# Patient Record
Sex: Female | Born: 1995 | Race: White | Hispanic: No | Marital: Single | Smoking: Never smoker
Health system: Southern US, Community
[De-identification: ages and names within clinical notes are randomized; demographics above are authoritative.]

---
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
## 2014-08-26 ENCOUNTER — Emergency Department (HOSPITAL_COMMUNITY)
Admission: EM | Admit: 2014-08-26 | Discharge: 2014-08-26 | Disposition: A | Discharge status: Home / Self Care | Payer: 59 | Attending: Emergency Medicine | Admitting: Emergency Medicine

## 2014-08-26 ENCOUNTER — Encounter (HOSPITAL_COMMUNITY): Payer: Self-pay | Admitting: Emergency Medicine

## 2014-08-26 DIAGNOSIS — Y998 Other external cause status: Secondary | ICD-10-CM | POA: Insufficient documentation

## 2014-08-26 DIAGNOSIS — Y9389 Activity, other specified: Secondary | ICD-10-CM | POA: Diagnosis not present

## 2014-08-26 DIAGNOSIS — S8991XA Unspecified injury of right lower leg, initial encounter: Secondary | ICD-10-CM | POA: Diagnosis present

## 2014-08-26 DIAGNOSIS — S8011XA Contusion of right lower leg, initial encounter: Secondary | ICD-10-CM | POA: Diagnosis not present

## 2014-08-26 DIAGNOSIS — Y92410 Unspecified street and highway as the place of occurrence of the external cause: Secondary | ICD-10-CM | POA: Insufficient documentation

## 2014-08-26 MED ORDER — IBUPROFEN 200 MG PO TABS
600.0000 mg | ORAL_TABLET | Freq: Once | ORAL | Status: AC
  Administered 2014-08-26: 600 mg via ORAL
  Filled 2014-08-26: qty 3

## 2014-08-26 MED ORDER — IBUPROFEN 600 MG PO TABS: 600.0000 mg | ORAL_TABLET | Freq: Four times a day (QID) | ORAL | Status: AC | PRN

## 2014-08-26 NOTE — Discharge Instructions (Signed)
Contusion °A contusion is a deep bruise. Contusions happen when an injury causes bleeding under the skin. Signs of bruising include pain, puffiness (swelling), and discolored skin. The contusion may turn blue, purple, or yellow. °HOME CARE  °· Put ice on the injured area. °¨ Put ice in a plastic bag. °¨ Place a towel between your skin and the bag. °¨ Leave the ice on for 15-20 minutes, 03-04 times a day. °· Only take medicine as told by your doctor. °· Rest the injured area. °· If possible, raise (elevate) the injured area to lessen puffiness. °GET HELP RIGHT AWAY IF:  °· You have more bruising or puffiness. °· You have pain that is getting worse. °· Your puffiness or pain is not helped by medicine. °MAKE SURE YOU:  °· Understand these instructions. °· Will watch your condition. °· Will get help right away if you are not doing well or get worse. °Document Released: 12/30/2007 Document Revised: 10/05/2011 Document Reviewed: 05/18/2011 °[employer]® Patient Information ©2015 [employer]. This information is not intended to replace advice given to you by your health care provider. Make sure you discuss any questions you have with your health care provider. ° °Cryotherapy °Cryotherapy means treatment with cold. Ice or gel packs can be used to reduce both pain and swelling. Ice is the most helpful within the first 24 to 48 hours after an injury or flare-up from overusing a muscle or joint. Sprains, strains, spasms, burning pain, shooting pain, and aches can all be eased with ice. Ice can also be used when recovering from surgery. Ice is effective, has very few side effects, and is safe for most people to use. °PRECAUTIONS  °Ice is not a safe treatment option for people with: °· Raynaud phenomenon. This is a condition affecting small blood vessels in the extremities. Exposure to cold may cause your problems to return. °· Cold hypersensitivity. There are many forms of cold hypersensitivity, including: °¨ Cold urticaria.  Red, itchy hives appear on the skin when the tissues begin to warm after being iced. °¨ Cold erythema. This is a red, itchy rash caused by exposure to cold. °¨ Cold hemoglobinuria. Red blood cells break down when the tissues begin to warm after being iced. The hemoglobin that carry oxygen are passed into the urine because they cannot combine with blood proteins fast enough. °· Numbness or altered sensitivity in the area being iced. °If you have any of the following conditions, do not use ice until you have discussed cryotherapy with your caregiver: °· Heart conditions, such as arrhythmia, angina, or chronic heart disease. °· High blood pressure. °· Healing wounds or open skin in the area being iced. °· Current infections. °· Rheumatoid arthritis. °· Poor circulation. °· Diabetes. °Ice slows the blood flow in the region it is applied. This is beneficial when trying to stop inflamed tissues from spreading irritating chemicals to surrounding tissues. However, if you expose your skin to cold temperatures for too long or without the proper protection, you can damage your skin or nerves. Watch for signs of skin damage due to cold. °HOME CARE INSTRUCTIONS °Follow these tips to use ice and cold packs safely. °· Place a dry or damp towel between the ice and skin. A damp towel will cool the skin more quickly, so you may need to shorten the time that the ice is used. °· For a more rapid response, add gentle compression to the ice. °· Ice for no more than 10 to 20 minutes at a time.   The bonier the area you are icing, the less time it will take to get the benefits of ice. °· Check your skin after 5 minutes to make sure there are no signs of a poor response to cold or skin damage. °· Rest 20 minutes or more between uses. °· Once your skin is numb, you can end your treatment. You can test numbness by very lightly touching your skin. The touch should be so light that you do not see the skin dimple from the pressure of your  fingertip. When using ice, most people will feel these normal sensations in this order: cold, burning, aching, and numbness. °· Do not use ice on someone who cannot communicate their responses to pain, such as small children or people with dementia. °HOW TO MAKE AN ICE PACK °Ice packs are the most common way to use ice therapy. Other methods include ice massage, ice baths, and cryosprays. Muscle creams that cause a cold, tingly feeling do not offer the same benefits that ice offers and should not be used as a substitute unless recommended by your caregiver. °To make an ice pack, do one of the following: °· Place crushed ice or a bag of frozen vegetables in a sealable plastic bag. Squeeze out the excess air. Place this bag inside another plastic bag. Slide the bag into a pillowcase or place a damp towel between your skin and the bag. °· Mix 3 parts water with 1 part rubbing alcohol. Freeze the mixture in a sealable plastic bag. When you remove the mixture from the freezer, it will be slushy. Squeeze out the excess air. Place this bag inside another plastic bag. Slide the bag into a pillowcase or place a damp towel between your skin and the bag. °SEEK MEDICAL CARE IF: °· You develop white spots on your skin. This may give the skin a blotchy (mottled) appearance. °· Your skin turns blue or pale. °· Your skin becomes waxy or hard. °· Your swelling gets worse. °MAKE SURE YOU:  °· Understand these instructions. °· Will watch your condition. °· Will get help right away if you are not doing well or get worse. °Document Released: 03/09/2011 Document Revised: 11/27/2013 Document Reviewed: 03/09/2011 °[employer]® Patient Information ©2015 [employer]. This information is not intended to replace advice given to you by your health care provider. Make sure you discuss any questions you have with your health care provider. ° °
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
